# Patient Record
Sex: Male | Born: 1970 | Race: White | Hispanic: No | Marital: Married
Health system: Southern US, Community
[De-identification: ages and names within clinical notes are randomized; demographics above are authoritative.]

## PROBLEM LIST (undated history)

## (undated) HISTORY — PX: WISDOM TOOTH EXTRACTION: SHX21

---

## 2015-09-30 ENCOUNTER — Ambulatory Visit: Payer: BLUE CROSS/BLUE SHIELD | Attending: Otolaryngology

## 2015-09-30 DIAGNOSIS — R490 Dysphonia: Secondary | ICD-10-CM | POA: Insufficient documentation

## 2015-10-05 NOTE — Patient Instructions (Signed)
Practice the abdominal breathing twice a day for at least 15 minutes

## 2015-10-05 NOTE — Therapy (Signed)
[facility] [address] [place], [place], [address] Phone: [phone]   Fax:  [phone]  Speech Language Pathology Evaluation  Patient Details  Name: Chadi Midyette MRN: [id number] Date of Birth: 1970/11/09 Referring Provider: Melida Quitter  Encounter Date: 09/30/2015      End of Session - 10/05/15 0848    Visit Number 1   Number of Visits 9   Date for SLP Re-Evaluation 11/25/15   SLP Start Time 0812   SLP Stop Time  [id number]  pt arrived 10 minutes late   SLP Time Calculation (min) 35 min   Activity Tolerance Patient tolerated treatment well      No past medical history on file.  No past surgical history on file.  There were no vitals filed for this visit.          SLP Evaluation [facility] - 10/05/15 0849    SLP Visit Information   SLP Received On 09/30/15   Referring Provider Melida Quitter   Onset Date 6 months   Medical Diagnosis Vocal nodules   Pain Assessment   Currently in Pain? No/denies   Cognition   Overall Cognitive Status Within Functional Limits for tasks assessed   Auditory Comprehension   Overall Auditory Comprehension Appears within functional limits for tasks assessed   Verbal Expression   Overall Verbal Expression Appears within functional limits for tasks assessed   Oral Motor/Sensory Function   Overall Oral Motor/Sensory Function Appears within functional limits for tasks assessed   Motor Speech   Phonation Impaired   Vocal Abuses Prolonged Vocal Use;Habitual Hyperphonia     Pt's rating of his voice today was 5.5/10 (10=normal voice). S/Z ratio was WNL, at 1.08, while sustained /a/ measured at 17 seconds, also WNL. Pt drinks almost enough water during the day as recommended (approx 50 oz). 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 Pt breaths primarily with his chest and tells  SLP he does not think his voice has any power.                    SLP Education - 10/05/15 0847    Education provided Yes   Education Details abdominal breathing (AB), vocal hygiene/conservation    Person(s) Educated Patient   Methods Explanation;Handout   Comprehension Verbalized understanding          SLP Short Term Goals - 10/05/15 1049    SLP SHORT TERM GOAL #1   Title pt will report following/feeling 2 aspects of vocal hygiene/conservation program between three sessions   Time 4   Period --  sessions   Status New   SLP SHORT TERM GOAL #2   Title pt will rate voice at or > 7.0/10 (10=normal voice) over two consecutive sessions   Time 4   Period --  visits   Status New          SLP Long Term Goals - 10/05/15 1050    SLP LONG TERM GOAL #1   Title pt will report feeling/focusing on 3 aspects of vocal hygiene/conservation program between five total sessions   Time 8   Period --  visits   Status New   SLP LONG TERM GOAL #2   Title pt will report voice quality of 8.0 or above over two sessions   Time 8   Period --  visits   Status New  SLP LONG TERM GOAL #3   Title pt will demo 10 minutes mod complex conversation using abdominal breathing 80% of the time   Time 8   Period --  visits   Status New          Plan - 10/05/15 0849    Clinical Impression Statement pt presents with min-mod hoarseness for >6 months, with vocal nodules ID'd on visual inspection. Pt would benefit from skilled ST addressing vocal hygiene and also correct/proper vocal use.   Speech Therapy Frequency 1x /week   Duration --  8 weeks   Treatment/Interventions SLP instruction and feedback;Patient/family education;Compensatory strategies;Internal/external aids   Potential to Achieve Goals Good   SLP Home Exercise Plan provided today   Consulted and Agree with Plan of Care Patient      Patient will benefit from skilled therapeutic intervention in order to improve the  following deficits and impairments:   Dysphonia    Problem List There are no active problems to display for this patient.   [facility], [place]  10/05/2015, 10:59 AM  [facility] [address] [place], [place], [address] Phone: [phone]   Fax:  [phone]  Name: Ripken Monterrosa MRN: [id number] Date of Birth: 1970/08/23

## 2015-10-20 ENCOUNTER — Ambulatory Visit: Payer: BLUE CROSS/BLUE SHIELD

## 2015-11-04 ENCOUNTER — Ambulatory Visit: Payer: BLUE CROSS/BLUE SHIELD | Attending: Otolaryngology

## 2015-11-04 DIAGNOSIS — R490 Dysphonia: Secondary | ICD-10-CM | POA: Insufficient documentation

## 2015-11-04 NOTE — Therapy (Signed)
[facility] [address] [place], [place], [address] Phone: [phone]   Fax:  [phone]  Speech Language Pathology Treatment  Patient Details  Name: Philip Hall MRN: [id number] Date of Birth: 02/28/1971 Referring Provider: Melida Quitter  Encounter Date: 11/04/2015      End of Session - 11/04/15 1011    Visit Number 2   Number of Visits 9   Date for SLP Re-Evaluation 11/25/15   SLP Start Time 0807  pt 6 minutes late   SLP Stop Time  0847   SLP Time Calculation (min) 40 min   Activity Tolerance Patient tolerated treatment well      No past medical history on file.  No past surgical history on file.  There were no vitals filed for this visit.      Subjective Assessment - 11/04/15 0811    Subjective Pt rates voice 4/10 today. "(I've been a) Bad patient."   Currently in Pain? No/denies               ADULT SLP TREATMENT - 11/04/15 0838      General Information   Behavior/Cognition Alert;Cooperative;Pleasant mood     Treatment Provided   Treatment provided Cognitive-Linquistic     Cognitive-Linquistic Treatment   Treatment focused on Voice   Skilled Treatment Pt has not completed practice with abdominal breathing (AB) as SLP directed, but has made gains with H2O intake and some gains with reducing frequency of use/overuse. SLP reiterated pt must also make strides with reduction of throat clearing as pt cleared throat x5 times in first ten minutes of tx. After that pt cleared throat 5 times (in last 30 minutes).  SLP also reiterated the need to practice AB at least 15 minutes twice a day in order to break habit of clavicular/chest breathing. Upon observation pt noted to perform AB correctly at rest when focusing on this. Pt and SLP came to the conclusion that pt knew what he needed to do, and was completing AB correctly, he just needed to complete what he know he needs to do. Discharge likely next  visit, if another visit occurs. Pt is going to make follow up with Dr. Redmond Baseman and wait until after that follow up to fully d/c from [facility] or not. Pt to contact SLP after [facility] visit.     Assessment / Recommendations / Plan   Plan Other (Comment)  see "Skilled treatment" last sentence     Progression Toward Goals   Progression toward goals Progressing toward goals          SLP Education - 11/04/15 1011    Education provided Yes   Education Details AB, need to practice AB x2/day, vocal hygiene   Methods Explanation   Comprehension Verbalized understanding          SLP Short Term Goals - 11/04/15 1013      SLP SHORT TERM GOAL #1   Title pt will report following/feeling 2 aspects of vocal hygiene/conservation program between three sessions   Baseline one session 11-04-15   Time 4   Period --  sessions   Status On-going     SLP SHORT TERM GOAL #2   Title pt will rate voice at or > 7.0/10 (10=normal voice) over two consecutive sessions   Time 4   Period --  visits   Status On-going          SLP Long Term Goals - 11/04/15 1013      SLP LONG  TERM GOAL #1   Title pt will report feeling/focusing on 3 aspects of vocal hygiene/conservation program between five total sessions   Time 8   Period --  visits   Status On-going     SLP LONG TERM GOAL #2   Title pt will report voice quality of 8.0 or above over two sessions   Time 8   Period --  visits   Status On-going     SLP LONG TERM GOAL #3   Title pt will demo 10 minutes mod complex conversation using abdominal breathing 80% of the time   Time 8   Period --  visits   Status On-going          Plan - 11/04/15 1011    Clinical Impression Statement pt presents with mod hoarseness today, worse since eval. Pt has made some chanages to lifestyle to improve vocal health however has not completed AB practice and in SLP's opinion has not gone to extent necessary to heal his voice. He would benefit from skilled ST addressing  vocal hygiene and also correct/proper vocal use.   Speech Therapy Frequency 1x /week   Duration --  8 weeks   Treatment/Interventions SLP instruction and feedback;Patient/family education;Compensatory strategies;Internal/external aids   Potential to Achieve Goals Fair   Potential Considerations Cooperation/participation level      Patient will benefit from skilled therapeutic intervention in order to improve the following deficits and impairments:   Dysphonia    Problem List There are no active problems to display for this patient.   [facility] ,[place], [place]  11/04/2015, 10:14 AM  [facility] [address] [place], [place], [address] Phone: [phone]   Fax:  [phone]   Name: Philip Hall MRN: [id number] Date of Birth: 07/19/1970

## 2015-11-04 NOTE — Patient Instructions (Signed)
Continue to focus on incr'd water, less talking and less loud talking (no/limited phone conversations in car; talk with bluetooth near mouth if necessary) Practice abdominal breathing 15 minutes twice a day

## 2015-11-17 DIAGNOSIS — M79661 Pain in right lower leg: Secondary | ICD-10-CM | POA: Diagnosis not present

## 2015-11-29 DIAGNOSIS — R490 Dysphonia: Secondary | ICD-10-CM | POA: Diagnosis not present

## 2015-11-29 DIAGNOSIS — J383 Other diseases of vocal cords: Secondary | ICD-10-CM | POA: Diagnosis not present

## 2015-12-12 DIAGNOSIS — J040 Acute laryngitis: Secondary | ICD-10-CM | POA: Diagnosis not present

## 2015-12-12 DIAGNOSIS — J381 Polyp of vocal cord and larynx: Secondary | ICD-10-CM | POA: Diagnosis not present

## 2015-12-12 DIAGNOSIS — R490 Dysphonia: Secondary | ICD-10-CM | POA: Diagnosis not present

## 2015-12-12 DIAGNOSIS — J383 Other diseases of vocal cords: Secondary | ICD-10-CM | POA: Diagnosis not present

## 2016-04-23 DIAGNOSIS — H9122 Sudden idiopathic hearing loss, left ear: Secondary | ICD-10-CM | POA: Diagnosis not present

## 2016-05-18 DIAGNOSIS — H9042 Sensorineural hearing loss, unilateral, left ear, with unrestricted hearing on the contralateral side: Secondary | ICD-10-CM | POA: Diagnosis not present

## 2016-05-18 DIAGNOSIS — H9122 Sudden idiopathic hearing loss, left ear: Secondary | ICD-10-CM | POA: Diagnosis not present

## 2016-08-07 DIAGNOSIS — Z125 Encounter for screening for malignant neoplasm of prostate: Secondary | ICD-10-CM | POA: Diagnosis not present

## 2016-08-07 DIAGNOSIS — Z0000 Encounter for general adult medical examination without abnormal findings: Secondary | ICD-10-CM | POA: Diagnosis not present

## 2016-08-08 DIAGNOSIS — E784 Other hyperlipidemia: Secondary | ICD-10-CM | POA: Diagnosis not present

## 2016-08-08 DIAGNOSIS — Z125 Encounter for screening for malignant neoplasm of prostate: Secondary | ICD-10-CM | POA: Diagnosis not present

## 2016-08-08 DIAGNOSIS — Z0000 Encounter for general adult medical examination without abnormal findings: Secondary | ICD-10-CM | POA: Diagnosis not present

## 2017-01-10 NOTE — Therapy (Signed)
[facility] [address] [place], [place], [address] Phone: [phone]   Fax:  [phone]  Patient Details  Name: Philip Hall MRN: [id number] Date of Birth: May 03, 1970 Referring Provider:  No ref. provider found  Encounter Date: 01/10/2017  SPEECH THERAPY DISCHARGE SUMMARY  Visits from Start of Care: two   Current functional level related to goals / functional outcomes:  Pt's goals on his last scheduled therapy session (11-04-15)  and his last clinical impression statement were as follows:  SLP Short Term Goals - 11/04/15 1013              SLP SHORT TERM GOAL #1    Title pt will report following/feeling 2 aspects of vocal hygiene/conservation program between three sessions    Baseline one session 11-04-15    Time 4    Period --  sessions    Status On-going         SLP SHORT TERM GOAL #2    Title pt will rate voice at or > 7.0/10 (10=normal voice) over two consecutive sessions    Time 4    Period --  visits    Status On-going                       SLP Long Term Goals - 11/04/15 1013              SLP LONG TERM GOAL #1    Title pt will report feeling/focusing on 3 aspects of vocal hygiene/conservation program between five total sessions    Time 8    Period --  visits    Status On-going         SLP LONG TERM GOAL #2    Title pt will report voice quality of 8.0 or above over two sessions    Time 8    Period --  visits    Status On-going         SLP LONG TERM GOAL #3    Title pt will demo 10 minutes mod complex conversation using abdominal breathing 80% of the time    Time 8    Period --  visits    Status On-going                       Plan - 11/04/15 1011     Clinical Impression Statement pt presents with mod hoarseness today, worse since eval. Pt has made some chanages to lifestyle to improve vocal health however has not completed AB practice and in SLP's opinion has not gone to  extent necessary to heal his voice. He would benefit from skilled ST addressing vocal hygiene and also correct/proper vocal use.      Remaining deficits: Assumed deficits remain, as pt did not return to ST following his visit on 11-04-15.   Education / Equipment: Vocal hygiene measures.   Plan: Patient agrees to discharge.  Patient goals were not met. Patient is being discharged due to not returning since the last visit.  ?????       [place] ,MS, CCC-SLP  01/10/2017, 1:55 PM  [facility] [address] [place], [place], [address] Phone: [phone]   Fax:  [phone]

## 2017-03-26 HISTORY — PX: ACOUSTIC NEUROMA RESECTION: SHX5713

## 2017-11-22 DIAGNOSIS — E7849 Other hyperlipidemia: Secondary | ICD-10-CM | POA: Diagnosis not present

## 2017-11-22 DIAGNOSIS — Z125 Encounter for screening for malignant neoplasm of prostate: Secondary | ICD-10-CM | POA: Diagnosis not present

## 2017-11-29 DIAGNOSIS — Z1389 Encounter for screening for other disorder: Secondary | ICD-10-CM | POA: Diagnosis not present

## 2017-11-29 DIAGNOSIS — Z0000 Encounter for general adult medical examination without abnormal findings: Secondary | ICD-10-CM | POA: Diagnosis not present

## 2017-12-31 DIAGNOSIS — H9042 Sensorineural hearing loss, unilateral, left ear, with unrestricted hearing on the contralateral side: Secondary | ICD-10-CM | POA: Diagnosis not present

## 2018-01-03 ENCOUNTER — Other Ambulatory Visit: Payer: Self-pay | Admitting: Otolaryngology

## 2018-01-03 DIAGNOSIS — H9120 Sudden idiopathic hearing loss, unspecified ear: Secondary | ICD-10-CM

## 2018-01-21 ENCOUNTER — Ambulatory Visit
Admission: RE | Admit: 2018-01-21 | Discharge: 2018-01-21 | Disposition: A | Discharge status: Home / Self Care | Payer: BLUE CROSS/BLUE SHIELD | Source: Ambulatory Visit | Attending: Otolaryngology | Admitting: Otolaryngology

## 2018-01-21 DIAGNOSIS — H9120 Sudden idiopathic hearing loss, unspecified ear: Secondary | ICD-10-CM

## 2018-01-21 DIAGNOSIS — D333 Benign neoplasm of cranial nerves: Secondary | ICD-10-CM | POA: Diagnosis not present

## 2018-01-21 MED ORDER — GADOBENATE DIMEGLUMINE 529 MG/ML IV SOLN
16.0000 mL | Freq: Once | INTRAVENOUS | Status: AC | PRN
Start: 2018-01-21 — End: 2018-01-21
  Administered 2018-01-21: 16 mL via INTRAVENOUS

## 2018-01-23 DIAGNOSIS — H918X2 Other specified hearing loss, left ear: Secondary | ICD-10-CM | POA: Diagnosis not present

## 2018-01-23 DIAGNOSIS — D333 Benign neoplasm of cranial nerves: Secondary | ICD-10-CM | POA: Diagnosis not present

## 2018-02-03 DIAGNOSIS — R030 Elevated blood-pressure reading, without diagnosis of hypertension: Secondary | ICD-10-CM | POA: Diagnosis not present

## 2018-02-03 DIAGNOSIS — D333 Benign neoplasm of cranial nerves: Secondary | ICD-10-CM | POA: Diagnosis not present

## 2018-02-05 DIAGNOSIS — D333 Benign neoplasm of cranial nerves: Secondary | ICD-10-CM | POA: Diagnosis not present

## 2018-02-10 DIAGNOSIS — D333 Benign neoplasm of cranial nerves: Secondary | ICD-10-CM | POA: Diagnosis not present

## 2018-03-04 DIAGNOSIS — D331 Benign neoplasm of brain, infratentorial: Secondary | ICD-10-CM | POA: Diagnosis not present

## 2018-03-04 DIAGNOSIS — M8938 Hypertrophy of bone, other site: Secondary | ICD-10-CM | POA: Diagnosis not present

## 2018-03-04 DIAGNOSIS — G9389 Other specified disorders of brain: Secondary | ICD-10-CM | POA: Diagnosis not present

## 2018-03-04 DIAGNOSIS — R110 Nausea: Secondary | ICD-10-CM | POA: Diagnosis not present

## 2018-03-04 DIAGNOSIS — D496 Neoplasm of unspecified behavior of brain: Secondary | ICD-10-CM | POA: Diagnosis not present

## 2018-03-04 DIAGNOSIS — D333 Benign neoplasm of cranial nerves: Secondary | ICD-10-CM | POA: Diagnosis not present

## 2018-03-04 DIAGNOSIS — H918X2 Other specified hearing loss, left ear: Secondary | ICD-10-CM | POA: Diagnosis not present

## 2018-03-05 DIAGNOSIS — G9389 Other specified disorders of brain: Secondary | ICD-10-CM | POA: Diagnosis not present

## 2018-03-06 ENCOUNTER — Other Ambulatory Visit: Payer: Self-pay | Admitting: Neurosurgery

## 2018-03-06 DIAGNOSIS — D333 Benign neoplasm of cranial nerves: Secondary | ICD-10-CM

## 2018-03-24 ENCOUNTER — Other Ambulatory Visit: Payer: Self-pay | Admitting: Neurosurgery

## 2018-03-27 ENCOUNTER — Ambulatory Visit
Admission: RE | Admit: 2018-03-27 | Discharge: 2018-03-27 | Disposition: A | Discharge status: Home / Self Care | Payer: BLUE CROSS/BLUE SHIELD | Source: Ambulatory Visit | Attending: Neurosurgery | Admitting: Neurosurgery

## 2018-03-27 DIAGNOSIS — D333 Benign neoplasm of cranial nerves: Secondary | ICD-10-CM

## 2018-03-27 DIAGNOSIS — Z9889 Other specified postprocedural states: Secondary | ICD-10-CM | POA: Diagnosis not present

## 2018-03-27 MED ORDER — GADOBENATE DIMEGLUMINE 529 MG/ML IV SOLN
16.0000 mL | Freq: Once | INTRAVENOUS | Status: AC | PRN
  Administered 2018-03-27: 16 mL via INTRAVENOUS

## 2018-04-01 ENCOUNTER — Other Ambulatory Visit: Payer: Self-pay | Admitting: Neurosurgery

## 2018-04-01 DIAGNOSIS — D333 Benign neoplasm of cranial nerves: Secondary | ICD-10-CM

## 2018-04-17 ENCOUNTER — Ambulatory Visit: Payer: BLUE CROSS/BLUE SHIELD | Attending: Neurosurgery | Admitting: Rehabilitative and Restorative Service Providers"

## 2018-04-17 ENCOUNTER — Encounter: Payer: Self-pay | Admitting: Rehabilitative and Restorative Service Providers"

## 2018-04-17 DIAGNOSIS — R2681 Unsteadiness on feet: Secondary | ICD-10-CM | POA: Diagnosis present

## 2018-04-17 DIAGNOSIS — R42 Dizziness and giddiness: Secondary | ICD-10-CM | POA: Diagnosis present

## 2018-04-17 DIAGNOSIS — R2689 Other abnormalities of gait and mobility: Secondary | ICD-10-CM | POA: Insufficient documentation

## 2018-04-17 NOTE — Patient Instructions (Signed)
Gaze Stabilization - Tip Card  1.Target must remain in focus, not blurry, and appear stationary while head is in motion. 2.Perform exercises with small head movements (45 to either side of midline). 3.Increase speed of head motion so long as target is in focus. 4.If you wear eyeglasses, be sure you can see target through lens (therapist will give specific instructions for bifocal / progressive lenses). 5.These exercises may provoke dizziness or nausea. Work through these symptoms. If too dizzy, slow head movement slightly. Rest between each exercise. 6.Exercises demand concentration; avoid distractions. 7.For safety, perform standing exercises close to a counter, wall, corner, or next to someone.  Copyright  VHI. All rights reserved.   Gaze Stabilization - Standing Feet Apart   Feet shoulder width apart, keeping eyes on target on wall 3 feet away, tilt head down slightly and move head side to side for 60 seconds. Repeat while moving head up and down for 60 seconds. *Work up to tolerating 60 seconds, as able. Do 3 sessions per day.  Copyright  VHI. All rights reserved.   Feet Together (Compliant Surface) Head Motion - Eyes Open    With eyes open, standing on compliant surface: __pillow___, feet together, move head slowly: up and down x 10 reps, side to side x 10 reps. Do __2__ sessions per day.  Copyright  VHI. All rights reserved.   Feet Together (Compliant Surface) Varied Arm Positions - Eyes Closed    Stand on compliant surface: _pillow___ with feet together and arms out. Close eyes and visualize upright position. Hold_30___ seconds. Repeat __3__ times per session. Do __2__ sessions per day.  Copyright  VHI. All rights reserved.   Single Leg - Eyes Closed    While standing on left leg, close eyes and maintain balance. Hold___10 _ seconds. Repeat __3__ times per session. Do __2__ sessions per day.  Copyright  VHI. All rights reserved.   Turning in Place: Solid  Surface    Standing in place, lead with head and turn slowly making full turns toward left. Repeat _5___ times per session. Do _2___ sessions per day.  Copyright  VHI. All rights reserved.

## 2018-04-18 NOTE — Therapy (Signed)
[facility] [address] [place], [place], [address] Phone: [phone]   Fax:  [phone]  Physical Therapy Evaluation  Patient Details  Name: Philip Hall MRN: [id number] Date of Birth: 1970/10/17 No data recorded  Encounter Date: 04/17/2018  PT End of Session - 04/18/18 1133    Visit Number  1    Number of Visits  6    Date for PT Re-Evaluation  06/17/18    Authorization Type  [employer] 30 VL    PT Start Time  0903    PT Stop Time  0938    PT Time Calculation (min)  35 min    Activity Tolerance  Patient tolerated treatment well    Behavior During Therapy  [facility] for tasks assessed/performed       History reviewed. No pertinent past medical history.  History reviewed. No pertinent surgical history.  There were no vitals filed for this visit.   Subjective Assessment - 04/17/18 0906    Subjective  The patient is s/p L acoustic neuroma resection 7 weeks ago. 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  He notes feeling more "fragile" with movement.  He was told to hold off on exertional exercise for 3 months.  He notes brief sensations of imbalance with quick turns.  He has returned to driving.     Pertinent History  no vision changes, L side hearing absent.     Patient Stated Goals  Full return to prior activity level.      Currently in Pain?  No/denies         [facility] PT Assessment - 04/17/18 0914      Assessment   Medical Diagnosis  L acoustic neuroma resection    Onset Date/Surgical Date  03/04/18    Hand Dominance  Right    Prior Therapy  none      Precautions   Precautions  None      Restrictions   Weight Bearing Restrictions  No      Balance Screen   Has the patient fallen in the past 6 months  No    Has the patient had a decrease in activity level because of a fear of falling?   No    Is the patient reluctant to leave their home because of a fear of falling?   No       Prior Function   Level of Independence  Independent    Vocation  Full time employment    Scientist, water quality, participates in surgery x hours, very physical in nature      Cognition   Overall Cognitive Status  Within Functional Limits for tasks assessed      Sensation   Light Touch  Appears Intact      ROM / Strength   AROM / PROM / Strength  AROM;Strength      AROM   Overall AROM   Within functional limits for tasks performed      Strength   Overall Strength  Within functional limits for tasks performed      Ambulation/Gait   Ambulation/Gait  Yes    Ambulation/Gait Assistance  7: Independent    Ambulation Distance (Feet)  300 Feet    Assistive device  None    Gait Pattern  Within Functional Limits    Ambulation Surface  Level;Indoor    Gait velocity  normal per observation    Gait Comments  Due to late start of eval and need to intiiate HEP, PT did not perform dynamic gait activities.  Plan to further assess FGA.      Balance   Balance Assessed  Yes      Static Standing Balance   Static Standing - Balance Support  No upper extremity supported    Static Standing - Level of Assistance  7: Independent    Static Standing Balance -  Activities   Single Leg Stance - Right Leg;Single Leg Stance - Left Leg;Romberg - Eyes Opened;Romberg - Eyes Closed;Romberg - Eyes Opened, Foam;Romberg - Eyes Closed , Foam    Static Standing - Comment/# of Minutes  Patient able to maintain SLS on R and L LEs x 10 seconds with postural correction at ankles and trunk.  He can perform 30 seconds EO/EC romberg on solid surfaces.  He can perform EO  + foam x 30 econds.  He can perform EC on foam x 30 seconds, however has significant sway noted.       Dynamic Standing Balance   Dynamic Standing - Balance Support  No upper extremity supported    Dynamic Standing - Level of Assistance  6: Modified independent (Device/Increase time)    Dynamic Standing - Balance Activities  Head  nods;Head turns;Compliant surface    Dynamic Standing - Comments  The patient has increased sway with narrowing base of support + head motion.  Also has difficulty on compliant surfaces with eyes closed.  He is able to self recover from postural sway.      High Level Balance   High Level Balance Comments  PT focused on challenging activities to initiate a home program.  *NEED TO FURTHER ASSESS SENSORY ORGANIZATION TESTING AND FGA.                Objective measurements completed on examination: See above findings.      [facility] Adult PT Treatment/Exercise - 04/17/18 0914      Neuro Re-ed    Neuro Re-ed Details   Initiated HEP-- see patient instructions.        Vestibular Treatment/Exercise - 04/17/18 1142      Vestibular Treatment/Exercise   Vestibular Treatment Provided  Gaze    Gaze Exercises  X1 Viewing Horizontal;X1 Viewing Vertical      X1 Viewing Horizontal   Foot Position  standing feet apart    Reps  2    Comments  PT educated on maintaining fixation on gaze and prioritizing this exercise for gaze adpatation.    Patient able to tolerate 60 seconds.       X1 Viewing Vertical   Foot Position  standing feet apart    Comments  Discussed performing for HEP            PT Education - 04/18/18 1132    Education Details  HEP initiated:  gaze x 1 adaptation horiz/vertical, 360 degree turns, single leg stance, foam with eyes closed, foam with eyes open + head motion    Person(s) Educated  Patient    Methods  Explanation;Demonstration;Handout    Comprehension  Verbalized understanding;Returned demonstration       PT Short Term Goals - 04/18/18 1144      PT SHORT TERM GOAL #1   Title  The patient will be indep with HEP for gaze x 1 viewing, high level balance, habituation activities.    Time  4    Period  Weeks    Status  New    Target  Date  05/18/18      PT SHORT TERM GOAL #2   Title  The patient will tolerate gaze x 1 adaptation x 2 minutes nonstop with  symptoms < or equal to 5/10    Time  4    Period  Weeks    Status  New    Target Date  05/18/18      PT SHORT TERM GOAL #3   Title  The patient will have SOT completed for baseline measure of vestibular input use.    Time  4    Period  Weeks    Status  New    Target Date  05/18/18      PT SHORT TERM GOAL #4   Title  The patinet will have FGA completed for baseline measure.    Time  4    Period  Weeks    Status  New    Target Date  05/18/18      PT SHORT TERM GOAL #5   Title  The patient will have SVA versus DVA completed.    Time  4    Period  Weeks    Status  New    Target Date  05/18/18        PT Long Term Goals - 04/18/18 1146      PT LONG TERM GOAL #1   Title  The patient will be indep with HEP progression.    Time  8    Period  Weeks    Status  New    Target Date  06/17/18      PT LONG TERM GOAL #2   Title  SOT, FGA, and SVA/DVA goals to follow once baseline established.    Time  8    Status  New    Target Date  06/17/18      PT LONG TERM GOAL #3   Title  The patient will note return to prior work schedule, tolerating long standing times and physical activity required to perform job duties.    Time  8    Period  Weeks    Status  New    Target Date  06/17/18             Plan - 04/18/18 1306    Clinical Impression Statement  The patient is a 48 year old male presenting to OP physical therapy 7 weeks post L acoustic neuroma resection.  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  Today's evaluation began late due to patient running late, therefore, established HEP to initiate vestibular rehab and plan to further assess FGA, SOT and dynamic visual acuity at next visit.    Clinical Presentation  Stable    Clinical Decision Making  Low    Rehab Potential  Good    PT Frequency  1x / week    PT Duration  6 weeks     PT Treatment/Interventions  ADLs/Self Care Home Management;Therapeutic activities;Therapeutic exercise;Vestibular;Patient/family education;Functional mobility training;Gait training;Neuromuscular re-education;Balance training;Manual techniques    PT Next Visit Plan  BEGIN WITH SOT, FGA, DVA.  Then check HEP, progress gaze x 1 up to 2 minutes and narrow base of support as able.  Add dynamic balance activities on compliant surfaces as able.    Consulted and Agree with Plan of Care  Patient       Patient will benefit  from skilled therapeutic intervention in order to improve the following deficits and impairments:  Abnormal gait, Decreased activity tolerance, Decreased balance, Dizziness  Visit Diagnosis: Other abnormalities of gait and mobility  Unsteadiness on feet  Dizziness and giddiness     Problem List There are no active problems to display for this patient.   [place], [place] 04/18/2018, 1:09 PM  [facility] [address] [place], [place], [address] Phone: [phone]   Fax:  [phone]  Name: Hodari Chuba MRN: [id number] Date of Birth: 11/30/1970

## 2018-05-01 ENCOUNTER — Ambulatory Visit: Payer: BLUE CROSS/BLUE SHIELD | Attending: Neurosurgery | Admitting: Physical Therapy

## 2018-05-01 ENCOUNTER — Encounter: Payer: Self-pay | Admitting: Physical Therapy

## 2018-05-01 DIAGNOSIS — R2681 Unsteadiness on feet: Secondary | ICD-10-CM | POA: Insufficient documentation

## 2018-05-01 DIAGNOSIS — R2689 Other abnormalities of gait and mobility: Secondary | ICD-10-CM | POA: Diagnosis not present

## 2018-05-01 DIAGNOSIS — R42 Dizziness and giddiness: Secondary | ICD-10-CM | POA: Insufficient documentation

## 2018-05-01 NOTE — Therapy (Signed)
[facility] [address] [place], [place], [address] Phone: [phone]   Fax:  [phone]  Physical Therapy Treatment  Patient Details  Name: Philip Hall MRN: [id number] Date of Birth: [birth year] Referring Provider (PT): Recardo Evangelist, MD   Encounter Date: 05/01/2018  PT End of Session - 05/01/18 0813    Visit Number  2    Number of Visits  6    Date for PT Re-Evaluation  06/17/18    Authorization Type  BCBS 30 VL    Authorization - Visit Number  2    Authorization - Number of Visits  30    PT Start Time  [phone]   late arrival   PT Stop Time  0848    PT Time Calculation (min)  36 min    Activity Tolerance  Patient tolerated treatment well    Behavior During Therapy  [facility] for tasks assessed/performed       History reviewed. No pertinent past medical history.  History reviewed. No pertinent surgical history.  There were no vitals filed for this visit.  Subjective Assessment - 05/01/18 1327    Subjective  Reports he is going to do his first surgery later today. Asking appropriate questions re: progression and benefit of rehab (vs what is spontaneous recovery).     Pertinent History  no vision changes, L side hearing absent.     Patient Stated Goals  Full return to prior activity level.      Currently in Pain?  No/denies             Vestibular Assessment - 05/01/18 0001      Visual Acuity   Static  10    Dynamic  4      Tandem walking x 10 steps with very minor imbalance (WNL); did not do FGA          Vestibular Treatment/Exercise - 05/01/18 0001      Vestibular Treatment/Exercise   Vestibular Treatment Provided  Gaze    Gaze Exercises  X1 Viewing Horizontal;X1 Viewing Vertical      X1 Viewing Horizontal   Foot Position  feet apart and then together     Time  --   feet apart x 60 seconds; ft together 30 sec   Reps  3    Comments  educated on degree of head turn, speed of movement  (goal 2/second); need to push until moderately dizzy/offbalance (he had been stopping when he begins to feel bad)      X1 Viewing Vertical   Foot Position  standing feet apart; standing feet together     Time  --   45 sec no effects   Reps  2    Comments  educated on degree of head turn, speed of movement (goal 2/second); need to push until moderately dizzy/offbalance (he had been stopping when he begins to feel bad)         Balance Exercises - 05/01/18 1336      Balance Exercises: Standing   Tandem Stance  Eyes closed;10 secs    SLS  Eyes closed   5 sec   Gait with Head Turns  Forward;Retro;4 reps   and each diagonal; negligable effect up to rt; ++up to left   Tandem Gait  Forward;Upper extremity support   EC   Retro Gait  Head turns   EO and EC with imbalance and off path with head turns       PT Education -  05/01/18 1341    Education Details  how to progress VOR exercise as he improves; rationale for these ex's (esp as pt wants to get back to skiing (water and snow) and surfing; added to HEP; ways to incorporate ex's into his work; consideration for placement of fluor screen during surgery (gets more dizzy with left head turns); how to ease back into aerobic exercise (60-70%of max HR and how to calculate) stationary cycle vs elliptical vs walking on treadmill vs jogging on treadmill and finally jogging over ground; recommend wait 3 mos for weight lifting and concern with increasing intracranial pressure    Person(s) Educated  Patient    Methods  Explanation;Demonstration;Tactile cues;Verbal cues;Handout    Comprehension  Verbalized understanding;Returned demonstration;Verbal cues required;Tactile cues required;Need further instruction       PT Short Term Goals - 04/18/18 1144      PT SHORT TERM GOAL #1   Title  The patient will be indep with HEP for gaze x 1 viewing, high level balance, habituation activities.    Time  4    Period  Weeks    Status  New    Target Date   05/18/18      PT SHORT TERM GOAL #2   Title  The patient will tolerate gaze x 1 adaptation x 2 minutes nonstop with symptoms < or equal to 5/10    Time  4    Period  Weeks    Status  New    Target Date  05/18/18      PT SHORT TERM GOAL #3   Title  The patient will have SOT completed for baseline measure of vestibular input use.    Time  4    Period  Weeks    Status  New    Target Date  05/18/18      PT SHORT TERM GOAL #4   Title  The patinet will have FGA completed for baseline measure.    Time  4    Period  Weeks    Status  New    Target Date  05/18/18      PT SHORT TERM GOAL #5   Title  The patient will have SVA versus DVA completed.    Time  4    Period  Weeks    Status  New    Target Date  05/18/18        PT Long Term Goals - 04/18/18 1146      PT LONG TERM GOAL #1   Title  The patient will be indep with HEP progression.    Time  8    Period  Weeks    Status  New    Target Date  06/17/18      PT LONG TERM GOAL #2   Title  SOT, FGA, and SVA/DVA goals to follow once baseline established.    Time  8    Status  New    Target Date  06/17/18      PT LONG TERM GOAL #3   Title  The patient will note return to prior work schedule, tolerating long standing times and physical activity required to perform job duties.    Time  8    Period  Weeks    Status  New    Target Date  06/17/18            Plan - 05/01/18 1340    Clinical Impression Statement  Patient's HEP updated and corrected his technique  for VORx1. Patient continues to have imbalance and dizziness with higher level skills that involve eyes open and head turns and/or narrow BOS, as well as activities with eyes closed. (Patient admits surprised at the difficulty he had with today's activities). Did not complete SOT due to late arrival, however 6 line difference in static vs dynamic acuity shows continued significant deficit. Educated on potential use of Balancemaster for eval and treatment. Patient can  continue to benefit from OPPT.     Rehab Potential  Good    PT Frequency  1x / week    PT Duration  6 weeks    PT Treatment/Interventions  ADLs/Self Care Home Management;Therapeutic activities;Therapeutic exercise;Vestibular;Patient/family education;Functional mobility training;Gait training;Neuromuscular re-education;Balance training;Manual techniques    PT Next Visit Plan  ?SOT vs use balance master for training??  check HEP, how has he progressed gaze x 1?  Add dynamic balance activities on compliant surfaces as able.    PT Home Exercise Plan  [email or web address]    Consulted and Agree with Plan of Care  Patient       Patient will benefit from skilled therapeutic intervention in order to improve the following deficits and impairments:  Abnormal gait, Decreased activity tolerance, Decreased balance, Dizziness  Visit Diagnosis: Other abnormalities of gait and mobility  Unsteadiness on feet  Dizziness and giddiness     Problem List There are no active problems to display for this patient.   [employer] PT 05/01/2018, 1:53 PM  [facility] [address] [place], [place], [address] Phone: [phone]   Fax:  [phone]  Name: Philip Hall MRN: [id number] Date of Birth: [birth year]

## 2018-05-01 NOTE — Patient Instructions (Addendum)
Access Code: [email or web address]  URL: [email or web address]  Date: 05/01/2018  Prepared by: Barry Brunner   Exercises  Tandem Stance with Eyes Closed - 10 reps - 1 sets - 1-2x daily - 7x weekly  Backwards Walking - 5 reps - 1 sets - - hold - 1x daily - 7x weekly   Gaze Stabilization - Standing Feet Apart   Feet shoulder width apart, keeping eyes on target on wall 3 feet away, tilt head down slightly and move head side to side for 60 seconds. Repeat while moving head up and down for 60 seconds. *Work up to tolerating 60 seconds, as able. Do 3 sessions per day.    Progression: keep feet apart and increase head speed  Then bring feet closer together Then move towards tandem

## 2018-05-15 ENCOUNTER — Ambulatory Visit: Payer: BLUE CROSS/BLUE SHIELD | Admitting: Rehabilitative and Restorative Service Providers"

## 2018-05-28 ENCOUNTER — Other Ambulatory Visit: Payer: Self-pay | Admitting: Specialist

## 2018-05-28 DIAGNOSIS — M25561 Pain in right knee: Secondary | ICD-10-CM

## 2018-05-29 ENCOUNTER — Ambulatory Visit: Payer: BLUE CROSS/BLUE SHIELD | Admitting: Rehabilitative and Restorative Service Providers"

## 2018-05-30 ENCOUNTER — Ambulatory Visit
Admission: RE | Admit: 2018-05-30 | Discharge: 2018-05-30 | Disposition: A | Discharge status: Home / Self Care | Payer: BLUE CROSS/BLUE SHIELD | Source: Ambulatory Visit | Attending: Specialist | Admitting: Specialist

## 2018-05-30 DIAGNOSIS — M25561 Pain in right knee: Secondary | ICD-10-CM

## 2018-05-30 DIAGNOSIS — M23321 Other meniscus derangements, posterior horn of medial meniscus, right knee: Secondary | ICD-10-CM | POA: Diagnosis not present

## 2018-06-02 DIAGNOSIS — M2241 Chondromalacia patellae, right knee: Secondary | ICD-10-CM | POA: Diagnosis not present

## 2018-06-02 DIAGNOSIS — S83231A Complex tear of medial meniscus, current injury, right knee, initial encounter: Secondary | ICD-10-CM | POA: Diagnosis not present

## 2018-06-02 DIAGNOSIS — M25561 Pain in right knee: Secondary | ICD-10-CM | POA: Diagnosis not present

## 2018-06-06 DIAGNOSIS — M23221 Derangement of posterior horn of medial meniscus due to old tear or injury, right knee: Secondary | ICD-10-CM | POA: Diagnosis not present

## 2018-06-06 DIAGNOSIS — M94261 Chondromalacia, right knee: Secondary | ICD-10-CM | POA: Diagnosis not present

## 2018-06-06 DIAGNOSIS — G8918 Other acute postprocedural pain: Secondary | ICD-10-CM | POA: Diagnosis not present

## 2018-09-01 ENCOUNTER — Other Ambulatory Visit (HOSPITAL_COMMUNITY)
Admission: RE | Admit: 2018-09-01 | Discharge: 2018-09-01 | Disposition: A | Discharge status: Home / Self Care | Payer: BC Managed Care – PPO | Source: Ambulatory Visit | Attending: General Surgery | Admitting: General Surgery

## 2018-09-01 DIAGNOSIS — Z1159 Encounter for screening for other viral diseases: Secondary | ICD-10-CM | POA: Diagnosis not present

## 2018-09-01 LAB — SARS CORONAVIRUS 2 BY RT PCR (HOSPITAL ORDER, PERFORMED IN ~~LOC~~ HOSPITAL LAB): SARS Coronavirus 2: NEGATIVE

## 2018-10-03 ENCOUNTER — Ambulatory Visit
Admission: RE | Admit: 2018-10-03 | Discharge: 2018-10-03 | Disposition: A | Discharge status: Home / Self Care | Payer: BLUE CROSS/BLUE SHIELD | Source: Ambulatory Visit | Attending: Neurosurgery | Admitting: Neurosurgery

## 2018-10-03 ENCOUNTER — Other Ambulatory Visit: Payer: Self-pay

## 2018-10-03 DIAGNOSIS — D333 Benign neoplasm of cranial nerves: Secondary | ICD-10-CM

## 2018-10-03 MED ORDER — GADOBENATE DIMEGLUMINE 529 MG/ML IV SOLN
15.0000 mL | Freq: Once | INTRAVENOUS | Status: AC | PRN
  Administered 2018-10-03: 15 mL via INTRAVENOUS

## 2018-12-05 DIAGNOSIS — E7849 Other hyperlipidemia: Secondary | ICD-10-CM | POA: Diagnosis not present

## 2018-12-05 DIAGNOSIS — Z0000 Encounter for general adult medical examination without abnormal findings: Secondary | ICD-10-CM | POA: Diagnosis not present

## 2018-12-05 DIAGNOSIS — Z125 Encounter for screening for malignant neoplasm of prostate: Secondary | ICD-10-CM | POA: Diagnosis not present

## 2018-12-12 DIAGNOSIS — E785 Hyperlipidemia, unspecified: Secondary | ICD-10-CM | POA: Diagnosis not present

## 2018-12-12 DIAGNOSIS — Z86018 Personal history of other benign neoplasm: Secondary | ICD-10-CM | POA: Diagnosis not present

## 2018-12-12 DIAGNOSIS — D696 Thrombocytopenia, unspecified: Secondary | ICD-10-CM | POA: Diagnosis not present

## 2018-12-12 DIAGNOSIS — H9192 Unspecified hearing loss, left ear: Secondary | ICD-10-CM | POA: Diagnosis not present

## 2018-12-12 DIAGNOSIS — Z0000 Encounter for general adult medical examination without abnormal findings: Secondary | ICD-10-CM | POA: Diagnosis not present

## 2018-12-12 DIAGNOSIS — Z1331 Encounter for screening for depression: Secondary | ICD-10-CM | POA: Diagnosis not present

## 2019-01-05 ENCOUNTER — Encounter: Payer: Self-pay | Admitting: Rehabilitative and Restorative Service Providers"

## 2019-01-05 NOTE — Therapy (Signed)
[facility] [address] [place], [place], [address] Phone: [phone]   Fax:  [phone]  Patient Details  Name: Philip Hall MRN: [id number] Date of Birth: 07/15/1970 Referring Provider:  No ref. provider found  Encounter Date: last encounter 05/01/18  PHYSICAL THERAPY DISCHARGE SUMMARY  Visits from Start of Care: 2  Current functional level related to goals / functional outcomes: Patient called to cancel remaining visits- progressing well.  See eval and tx note for most recent status.   Remaining deficits: See tx note 2/6   Education / Equipment: Home program.  Plan: Patient agrees to discharge.  Patient goals were not met. Patient is being discharged due to not returning since the last visit.  ?????     Thank you for the referral of this patient. Christina Weaver, MPT   WEAVER,CHRISTINA 01/05/2019, 11:27 AM  [facility] [address] [place], [place], [address] Phone: [phone]   Fax:  [phone] 

## 2019-01-21 ENCOUNTER — Other Ambulatory Visit: Payer: Self-pay

## 2019-01-21 ENCOUNTER — Other Ambulatory Visit (HOSPITAL_COMMUNITY)
Admission: RE | Admit: 2019-01-21 | Discharge: 2019-01-21 | Disposition: A | Discharge status: Home / Self Care | Payer: BC Managed Care – PPO | Source: Ambulatory Visit | Attending: General Surgery | Admitting: General Surgery

## 2019-01-21 DIAGNOSIS — Z01812 Encounter for preprocedural laboratory examination: Secondary | ICD-10-CM | POA: Insufficient documentation

## 2019-01-21 DIAGNOSIS — Z20828 Contact with and (suspected) exposure to other viral communicable diseases: Secondary | ICD-10-CM | POA: Insufficient documentation

## 2019-01-21 LAB — SARS CORONAVIRUS 2 (TAT 6-24 HRS): SARS Coronavirus 2: NEGATIVE

## 2019-02-02 ENCOUNTER — Other Ambulatory Visit: Payer: Self-pay

## 2019-02-02 ENCOUNTER — Other Ambulatory Visit (HOSPITAL_COMMUNITY)
Admission: RE | Admit: 2019-02-02 | Discharge: 2019-02-02 | Disposition: A | Discharge status: Home / Self Care | Payer: BC Managed Care – PPO | Source: Ambulatory Visit | Attending: General Surgery | Admitting: General Surgery

## 2019-02-02 DIAGNOSIS — Z01812 Encounter for preprocedural laboratory examination: Secondary | ICD-10-CM | POA: Diagnosis not present

## 2019-02-02 DIAGNOSIS — Z20828 Contact with and (suspected) exposure to other viral communicable diseases: Secondary | ICD-10-CM | POA: Diagnosis not present

## 2019-02-02 LAB — SARS CORONAVIRUS 2 (TAT 6-24 HRS): SARS Coronavirus 2: NEGATIVE

## 2019-11-16 DIAGNOSIS — Z20828 Contact with and (suspected) exposure to other viral communicable diseases: Secondary | ICD-10-CM | POA: Diagnosis not present

## 2019-12-21 DIAGNOSIS — Z0000 Encounter for general adult medical examination without abnormal findings: Secondary | ICD-10-CM | POA: Diagnosis not present

## 2019-12-21 DIAGNOSIS — E785 Hyperlipidemia, unspecified: Secondary | ICD-10-CM | POA: Diagnosis not present

## 2019-12-21 DIAGNOSIS — Z125 Encounter for screening for malignant neoplasm of prostate: Secondary | ICD-10-CM | POA: Diagnosis not present

## 2019-12-25 DIAGNOSIS — Z0000 Encounter for general adult medical examination without abnormal findings: Secondary | ICD-10-CM | POA: Diagnosis not present

## 2019-12-25 DIAGNOSIS — E785 Hyperlipidemia, unspecified: Secondary | ICD-10-CM | POA: Diagnosis not present

## 2019-12-25 DIAGNOSIS — Z1331 Encounter for screening for depression: Secondary | ICD-10-CM | POA: Diagnosis not present

## 2020-04-07 ENCOUNTER — Other Ambulatory Visit (HOSPITAL_COMMUNITY)
Admission: RE | Admit: 2020-04-07 | Discharge: 2020-04-07 | Disposition: A | Discharge status: Home / Self Care | Payer: Self-pay | Source: Ambulatory Visit | Attending: General Surgery | Admitting: General Surgery

## 2020-04-07 DIAGNOSIS — U071 COVID-19: Secondary | ICD-10-CM | POA: Insufficient documentation

## 2020-04-07 LAB — SARS CORONAVIRUS 2 BY RT PCR (HOSPITAL ORDER, PERFORMED IN ~~LOC~~ HOSPITAL LAB): SARS Coronavirus 2: POSITIVE — AB

## 2020-04-18 ENCOUNTER — Other Ambulatory Visit: Payer: Self-pay | Admitting: Neurosurgery

## 2020-04-18 DIAGNOSIS — H933X2 Disorders of left acoustic nerve: Secondary | ICD-10-CM

## 2020-05-06 ENCOUNTER — Other Ambulatory Visit: Payer: Self-pay

## 2020-05-06 ENCOUNTER — Ambulatory Visit
Admission: RE | Admit: 2020-05-06 | Discharge: 2020-05-06 | Disposition: A | Discharge status: Home / Self Care | Payer: BC Managed Care – PPO | Source: Ambulatory Visit | Attending: Neurosurgery | Admitting: Neurosurgery

## 2020-05-06 DIAGNOSIS — H933X2 Disorders of left acoustic nerve: Secondary | ICD-10-CM

## 2020-05-06 DIAGNOSIS — H748X2 Other specified disorders of left middle ear and mastoid: Secondary | ICD-10-CM | POA: Diagnosis not present

## 2020-05-06 DIAGNOSIS — G9389 Other specified disorders of brain: Secondary | ICD-10-CM | POA: Diagnosis not present

## 2020-05-06 DIAGNOSIS — G529 Cranial nerve disorder, unspecified: Secondary | ICD-10-CM | POA: Diagnosis not present

## 2020-05-06 MED ORDER — GADOBENATE DIMEGLUMINE 529 MG/ML IV SOLN
15.0000 mL | Freq: Once | INTRAVENOUS | Status: AC | PRN
  Administered 2020-05-06: 15 mL via INTRAVENOUS

## 2021-07-10 DIAGNOSIS — L219 Seborrheic dermatitis, unspecified: Secondary | ICD-10-CM | POA: Diagnosis not present

## 2021-12-27 IMAGING — MR MR HEAD WO/W CM
11 of 12 series · 38 of 48 positions shown · IV contrast (15ml Multihance)
Comparison: MRI of the brain October 03, 2018.

CLINICAL DATA: Disorder of left acoustic nerve.

EXAM:
MRI HEAD WITHOUT AND WITH CONTRAST
TECHNIQUE: Multiplanar, multiecho pulse sequences of the brain and surrounding
structures were obtained without and with intravenous contrast.
CONTRAST:  15mL MULTIHANCE GADOBENATE DIMEGLUMINE 529 MG/ML IV SOLN

[Series 2: T1 · sagittal · 5.0mm · 0.45mm/px · 4 of 25 slices shown (1 of 3)]
[im 1/25]
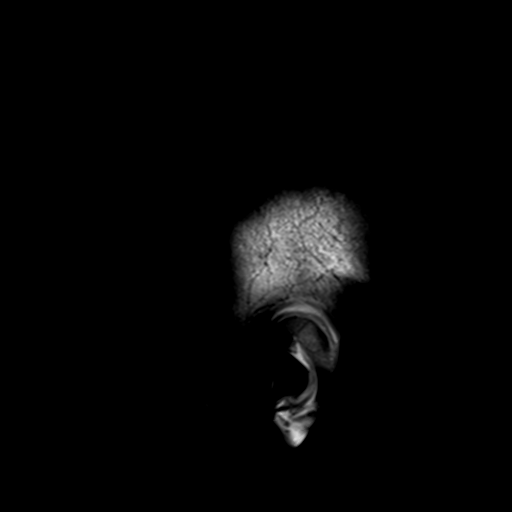
[im 9/25]
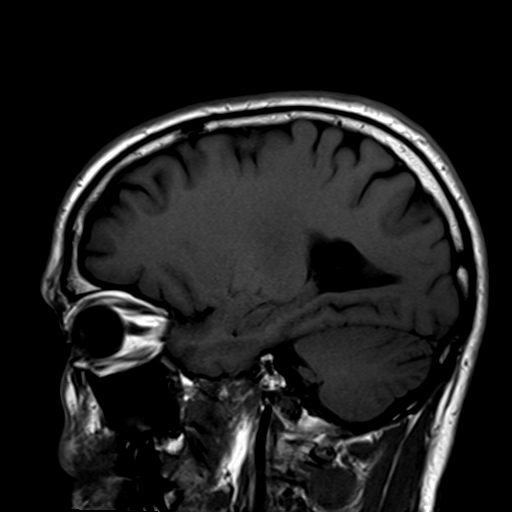
[im 17/25]
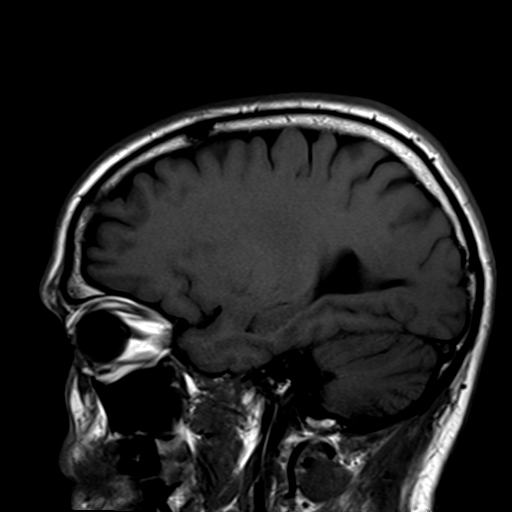
[im 25/25]
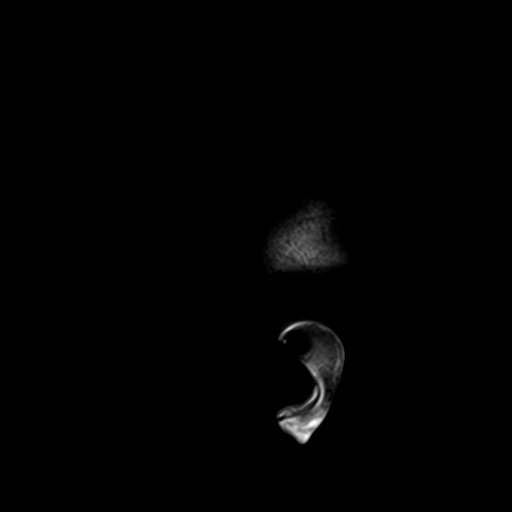

[Series 3: DWI · axial · 3.0mm · 1.80mm/px · z∈[-68,+88]mm · 11 of 106 slices shown (1 of 2)]
[im 1/106]
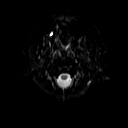
[im 11/106]
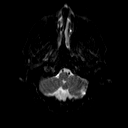
[im 22/106]
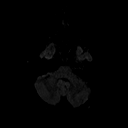
[im 32/106]
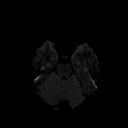
[im 43/106]
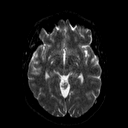
[im 53/106]
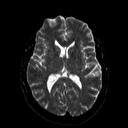
[im 64/106]
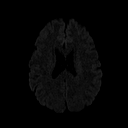
[im 74/106]
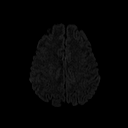
[im 85/106]
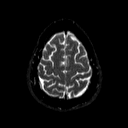
[im 95/106]
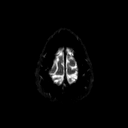
[im 106/106]
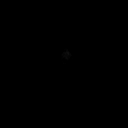

[Series 4: DWI · axial · 3.0mm · 1.80mm/px · z∈[-68,+88]mm · 5 of 51 slices shown (2 of 2)]
[im 1/51]
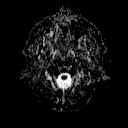
[im 13/51]
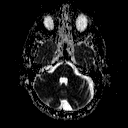
[im 26/51]
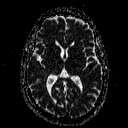
[im 38/51]
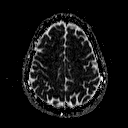
[im 51/51]
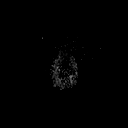

[Series 5: T2 · axial · 5.0mm · 0.45mm/px · z∈[-72,+91]mm · 3 of 26 slices shown]
[im 1/26]
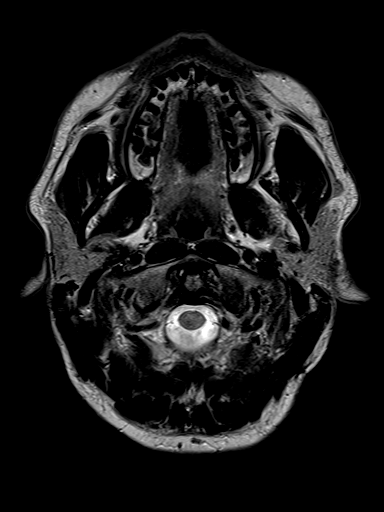
[im 13/26]
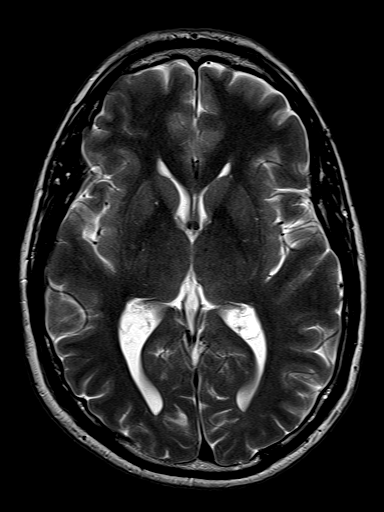
[im 26/26]
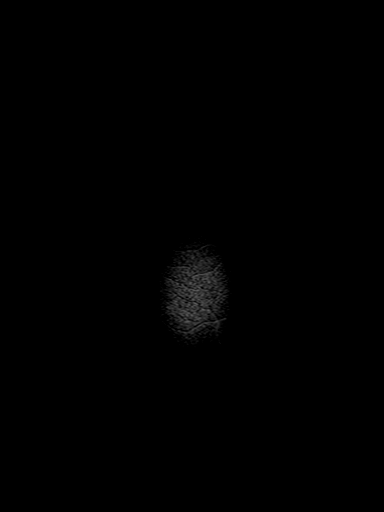

[Series 6: FLAIR · axial · 3.0mm · 0.45mm/px · z∈[-76,+91]mm · 3 of 29 slices shown]
[im 1/29]
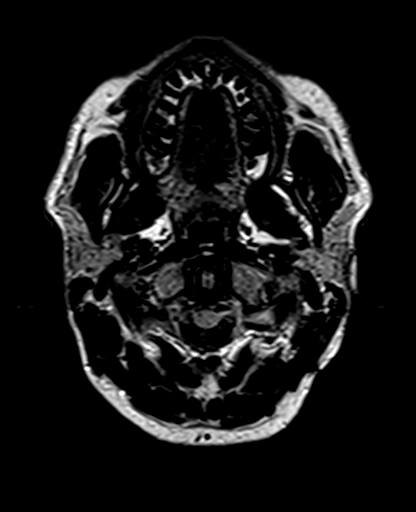
[im 15/29]
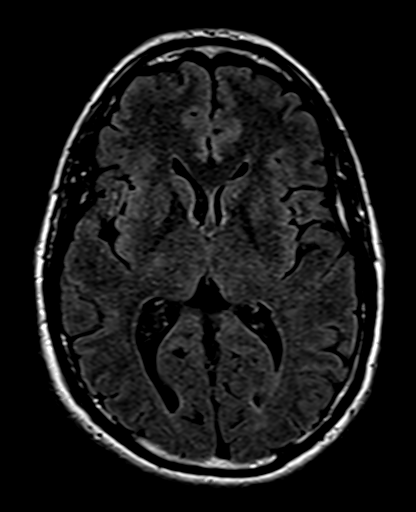
[im 29/29]
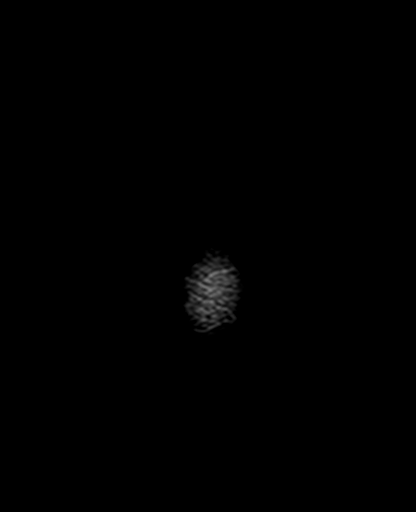

[Series 8: swi_images · axial · 3.0mm · 0.90mm/px · z∈[-67,+86]mm · 5 of 52 slices shown]
[im 1/52]
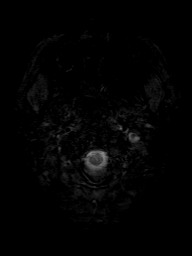
[im 13/52]
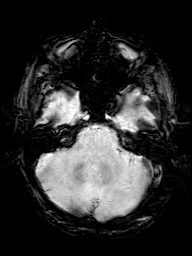
[im 26/52]
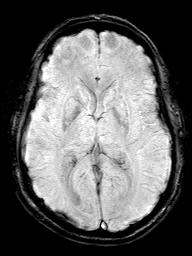
[im 39/52]
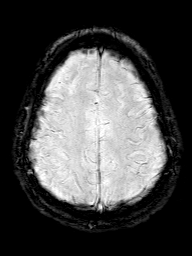
[im 52/52]
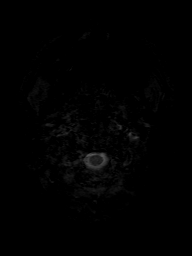

[Series 9: T1 · coronal · 3.0mm · 0.35mm/px · 1 of 11 slices shown (2 of 3)]
[im 1/11]
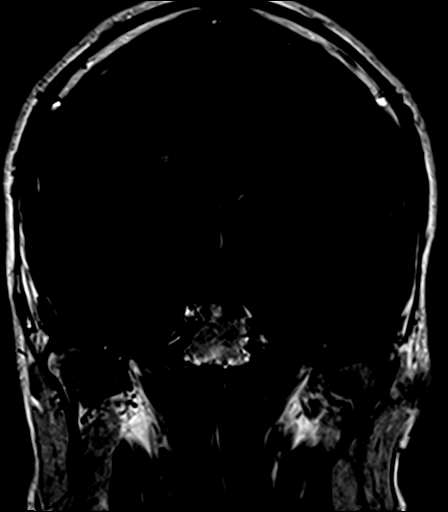

[Series 10: T1 · axial · 3.0mm · 0.35mm/px · 1 of 11 slices shown (3 of 3)]
[im 1/11]
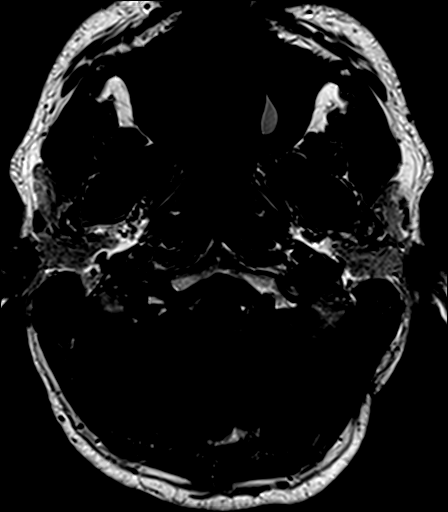

[Series 11: bSSFP · axial · 1.0mm · 0.28mm/px · z∈[-58,-35]mm · 3 of 36 slices shown]
[im 1/36]
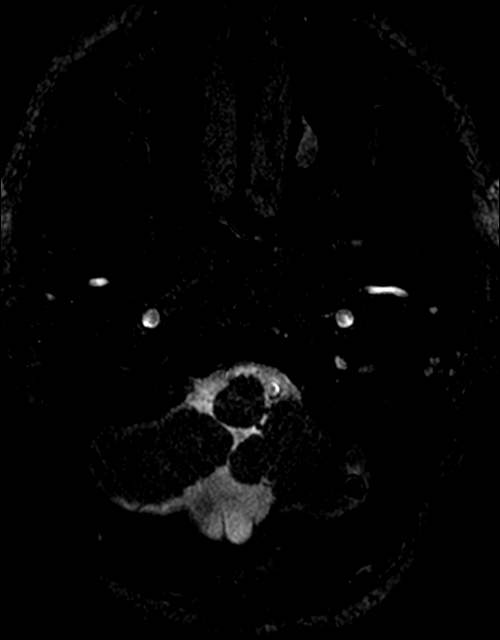
[im 12/36]
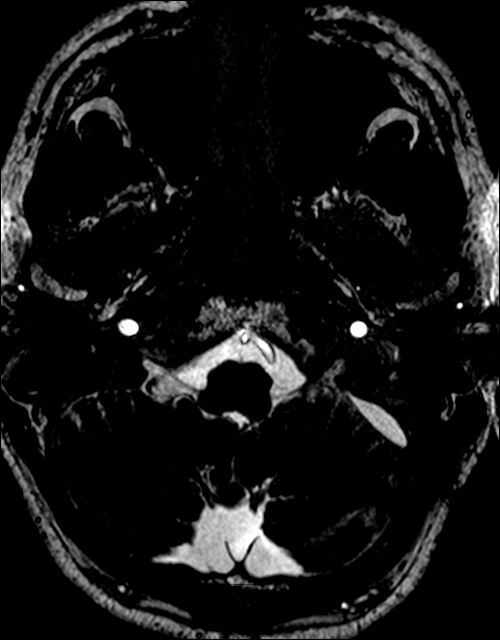
[im 24/36]
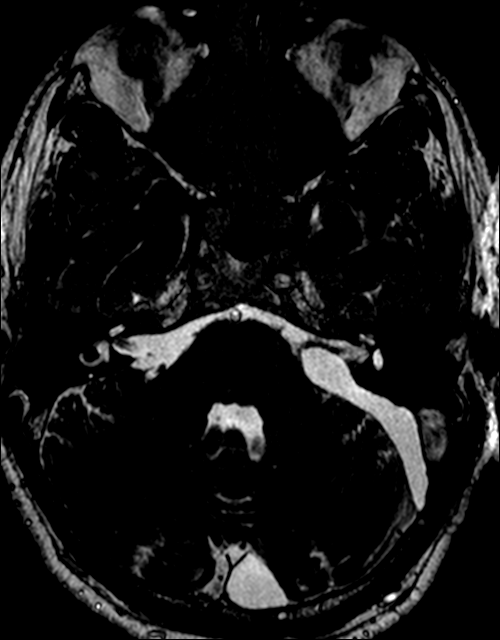

[Series 12: T1 post-contrast · coronal · 3.0mm · 0.35mm/px · 1 of 11 slices shown (1 of 2)]
[im 1/11]
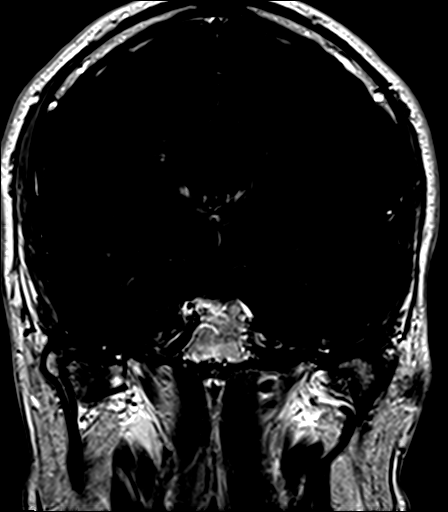

[Series 13: T1 post-contrast · axial · 3.0mm · 0.35mm/px · 1 of 11 slices shown (2 of 2)]
[im 1/11]
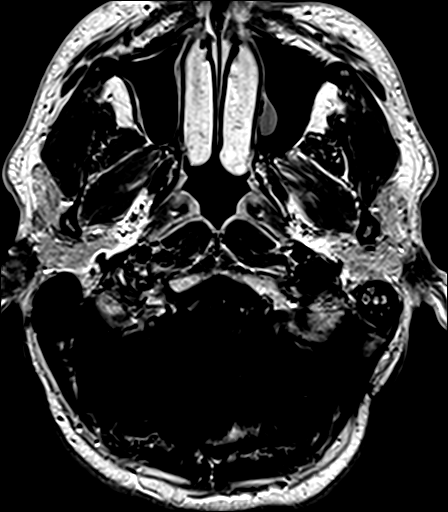

[38 of 48 positions shown; findings below may reference images not displayed]

FINDINGS: Brain: Postsurgical changes from left retrosigmoid craniotomy for
resection of left vestibular schwannoma with persistent left CSF
fluid collection in the left lateral aspect of the posterior fossa
measuring up to 7 mm (8 mm on prior). At the anterior margin of this
fluid collection, a thick linear contrast enhancement is seen
arching towards and extending into the left internal auditory canal.
This appear somewhat thinner when compared to prior MRI measuring
just under 2 mm in thickness compared to 2.4 mm on prior.

No acute infarction, hemorrhage or hydrocephalus.

Vascular: Normal flow voids.

Skull and upper cervical spine: Normal marrow signal.

Sinuses/Orbits: Mucosal thickening throughout the paranasal sinuses.
The orbits are maintained.

Other: Small left mastoid effusion.
IMPRESSION: 1. Further interval decrease in thickness of the curvilinear
cisternal and intracanalicular contrast enhancement which may
represent posttreatment changes and/or residual tumor.
2. Persistent left sided posterior fossa fluid collection which is
mildly decreased in thickness, measuring up to 7 mm in the current
study.

## 2022-04-11 ENCOUNTER — Other Ambulatory Visit: Payer: Self-pay | Admitting: Neurosurgery

## 2022-04-11 DIAGNOSIS — D333 Benign neoplasm of cranial nerves: Secondary | ICD-10-CM

## 2022-05-21 ENCOUNTER — Ambulatory Visit
Admission: RE | Admit: 2022-05-21 | Discharge: 2022-05-21 | Disposition: A | Discharge status: Home / Self Care | Payer: BC Managed Care – PPO | Source: Ambulatory Visit | Attending: Neurosurgery | Admitting: Neurosurgery

## 2022-05-21 DIAGNOSIS — D333 Benign neoplasm of cranial nerves: Secondary | ICD-10-CM

## 2022-05-21 MED ORDER — GADOPICLENOL 0.5 MMOL/ML IV SOLN
8.0000 mL | Freq: Once | INTRAVENOUS | Status: AC | PRN
  Administered 2022-05-21: 8 mL via INTRAVENOUS

## 2023-03-11 ENCOUNTER — Ambulatory Visit: Payer: BC Managed Care – PPO

## 2023-03-11 VITALS — Ht 70.0 in | Wt 179.0 lb

## 2023-03-11 DIAGNOSIS — Z1211 Encounter for screening for malignant neoplasm of colon: Secondary | ICD-10-CM

## 2023-03-11 MED ORDER — NA SULFATE-K SULFATE-MG SULF 17.5-3.13-1.6 GM/177ML PO SOLN: 1.0000 | Freq: Once | ORAL | 0 refills | Status: AC

## 2023-03-11 NOTE — Progress Notes (Signed)
Pre visit completed via phone call; Patient verified name, DOB, and address; No egg or soy allergy known to patient;  No issues known to pt with past sedation with any surgeries or procedures; Patient denies ever being told they had issues or difficulty with intubation;  No FH of Malignant Hyperthermia; Pt is not on diet pills; Pt is not on home 02;  Pt is not on blood thinners;  Pt denies issues with constipation;  No A fib or A flutter; Have any cardiac testing pending--NO Insurance verified during PV appt--- BCBS Pt can ambulate without assistance;  Pt denies use of chewing tobacco; Discussed diabetic/weight loss medication holds; Discussed NSAID holds; Checked BMI to be less than 50; Pt instructed to use Single[employer]com or GoodRx for a price reduction on prep;  Patient's chart reviewed by Cathlyn Parsons CNRA prior to previsit and patient appropriate for the [facility];  Pre visit completed and red dot placed by patient's name on their procedure day (on provider's schedule);   Instructions sent to MyChart per patient request, as well as printed and mailed to the patient;

## 2023-04-02 ENCOUNTER — Telehealth: Payer: Self-pay | Admitting: Gastroenterology

## 2023-04-02 NOTE — Telephone Encounter (Signed)
 PT is calling to have Suprep sent into [facility] on [address]. Please advise.

## 2023-04-02 NOTE — Telephone Encounter (Signed)
 Called and spoke with pharmacist at [facility] also informed that RX would be ready for pick up from pharmacy later today;  Patient verbalized understanding of information/instructions;

## 2023-04-03 ENCOUNTER — Encounter: Payer: Self-pay | Admitting: Gastroenterology

## 2023-04-08 ENCOUNTER — Encounter: Payer: Self-pay | Admitting: Gastroenterology

## 2023-04-08 ENCOUNTER — Ambulatory Visit (AMBULATORY_SURGERY_CENTER): Payer: BC Managed Care – PPO | Admitting: Gastroenterology

## 2023-04-08 VITALS — BP 113/66 | HR 52 | Temp 98.2°F | Resp 14 | Ht 70.0 in | Wt 179.0 lb

## 2023-04-08 DIAGNOSIS — Z800 Family history of malignant neoplasm of digestive organs: Secondary | ICD-10-CM | POA: Diagnosis not present

## 2023-04-08 DIAGNOSIS — K640 First degree hemorrhoids: Secondary | ICD-10-CM | POA: Diagnosis not present

## 2023-04-08 DIAGNOSIS — Z83719 Family history of colon polyps, unspecified: Secondary | ICD-10-CM

## 2023-04-08 DIAGNOSIS — Z1211 Encounter for screening for malignant neoplasm of colon: Secondary | ICD-10-CM

## 2023-04-08 MED ORDER — SODIUM CHLORIDE 0.9 % IV SOLN: 500.0000 mL | Freq: Once | INTRAVENOUS | Status: AC

## 2023-04-08 NOTE — Progress Notes (Signed)
 To pacu, VSS. Report to [email or web address]

## 2023-04-08 NOTE — Op Note (Signed)
 [facility] Patient Name: Philip Hall Procedure Date: 04/08/2023 9:05 AM MRN: [id number] Endoscopist: Sandor Flatter , MD, [id number] Age: 53 Referring MD:  Date of Birth: Jul 26, 1970 Gender: Male Account #: [id number] Procedure:                Colonoscopy Indications:              Screening for colorectal malignant neoplasm, This                            is the patient's first colonoscopy Medicines:                Monitored Anesthesia Care Procedure:                Pre-Anesthesia Assessment:                           - Prior to the procedure, a History and Physical                            was performed, and patient medications and                            allergies were reviewed. The patient's tolerance of                            previous anesthesia was also reviewed. The risks                            and benefits of the procedure and the sedation                            options and risks were discussed with the patient.                            All questions were answered, and informed consent                            was obtained. Prior Anticoagulants: The patient has                            taken no anticoagulant or antiplatelet agents. ASA                            Grade Assessment: I - A normal, healthy patient.                            After reviewing the risks and benefits, the patient                            was deemed in satisfactory condition to undergo the                            procedure.  After obtaining informed consent, the colonoscope                            was passed under direct vision. Throughout the                            procedure, the patient's blood pressure, pulse, and                            oxygen saturations were monitored continuously. The                            CF [id number] #[id number] was introduced through the anus                            and advanced to the the cecum,  identified by                            appendiceal orifice and ileocecal valve. The                            colonoscopy was performed without difficulty. The                            patient tolerated the procedure well. The quality                            of the bowel preparation was good. The ileocecal                            valve, appendiceal orifice, and rectum were                            photographed. Scope In: 9:11:39 AM Scope Out: 9:29:29 AM Scope Withdrawal Time: 0 hours 13 minutes 20 seconds  Total Procedure Duration: 0 hours 17 minutes 50 seconds  Findings:                 The perianal and digital rectal examinations were                            normal.                           The entire colon appeared normal.                           Non-bleeding internal hemorrhoids were found during                            retroflexion. The hemorrhoids were small and Grade                            I (internal hemorrhoids that do not prolapse). Complications:            No immediate complications.  Estimated Blood Loss:     Estimated blood loss: none. Impression:               - The entire examined colon is normal.                           - Non-bleeding internal hemorrhoids.                           - No specimens collected. Recommendation:           - Patient has a contact number available for                            emergencies. The signs and symptoms of potential                            delayed complications were discussed with the                            patient. Return to normal activities tomorrow.                            Written discharge instructions were provided to the                            patient.                           - Resume previous diet.                           - Continue present medications.                           - Repeat colonoscopy in 10 years for screening                            purposes.                            - Return to GI clinic PRN. Sandor Flatter, MD 04/08/2023 9:34:52 AM

## 2023-04-08 NOTE — Progress Notes (Signed)
   GASTROENTEROLOGY PROCEDURE H&P NOTE   Primary Care Physician: Waddell Rake, MD    Reason for Procedure:  Colon Cancer screening  Plan:    Colonoscopy  Patient is appropriate for endoscopic procedure(s) in the ambulatory ([facility]) setting.  The nature of the procedure, as well as the risks, benefits, and alternatives were carefully and thoroughly reviewed with the patient. Ample time for discussion and questions allowed. The patient understood, was satisfied, and agreed to proceed.     HPI: Philip Hall is a 53 y.o. male who presents for colonoscopy for routine Colon Cancer screening.  Fhx notable for paternal grandmother with colon cancer (age 74), father with colon polyps.  Patient is otherwise without complaints or active issues today.  No past medical history on file.  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   Current Facility-Administered Medications  Medication Dose Route Frequency Provider Last Rate Last Admin   0.9 %  sodium chloride  infusion  500 mL Intravenous Once Cruze Zingaro V, DO        Allergies as of 04/08/2023   (No Known Allergies)    Family History  Problem Relation Age of Onset   Colon polyps Father 24   Colon cancer Paternal Grandmother 65   Colon polyps Paternal Grandmother     Social History   Socioeconomic History   Marital status: Married    Spouse name: Not on file   Number of children: Not on file   Years of education: Not on file   Highest education level: Not on file  Occupational History   Not on file  Tobacco Use   Smoking status: Never   Smokeless tobacco: Never  Vaping Use   Vaping status: Never Used  Substance and Sexual Activity   Alcohol  use: Yes    Alcohol /week: 6.0 standard drinks of alcohol     Types: 6 Standard drinks or  equivalent per week   Drug use: Never   Sexual activity: Not on file  Other Topics Concern   Not on file  Social History Narrative   Not on file   Social Drivers of Health   Financial Resource Strain: Not on file  Food Insecurity: Not on file  Transportation Needs: Not on file  Physical Activity: Not on file  Stress: Not on file  Social Connections: Not on file  Intimate Partner Violence: Not on file    Physical Exam: Vital signs in last 24 hours: @BP  (!) 154/71   Pulse (!) 58   Temp 98.2 F (36.8 C)   Ht 5' 10 (1.778 m)   Wt 179 lb (81.2 kg)   SpO2 98%   BMI 25.68 kg/m  GEN: NAD EYE: Sclerae anicteric ENT: MMM CV: Non-tachycardic Pulm: CTA b/l GI: Soft, NT/ND NEURO:  Alert & Oriented x 3   Sandor Flatter, DO [facility]   04/08/2023 9:01 AM

## 2023-04-08 NOTE — Progress Notes (Signed)
 Pt's states no medical or surgical changes since previsit or office visit.

## 2023-04-08 NOTE — Patient Instructions (Signed)
 Resume previous diet and medications.  Repeat colonoscopy in 10 years.    YOU HAD AN ENDOSCOPIC PROCEDURE TODAY AT THE [facility]:   Refer to the procedure report that was given to you for any specific questions about what was found during the examination.  If the procedure report does not answer your questions, please call your gastroenterologist to clarify.  If you requested that your care partner not be given the details of your procedure findings, then the procedure report has been included in a sealed envelope for you to review at your convenience later.  YOU SHOULD EXPECT: Some feelings of bloating in the abdomen. Passage of more gas than usual.  Walking can help get rid of the air that was put into your GI tract during the procedure and reduce the bloating. If you had a lower endoscopy (such as a colonoscopy or flexible sigmoidoscopy) you may notice spotting of blood in your stool or on the toilet paper. If you underwent a bowel prep for your procedure, you may not have a normal bowel movement for a few days.  Please Note:  You might notice some irritation and congestion in your nose or some drainage.  This is from the oxygen used during your procedure.  There is no need for concern and it should clear up in a day or so.  SYMPTOMS TO REPORT IMMEDIATELY:  Following lower endoscopy (colonoscopy or flexible sigmoidoscopy):  Excessive amounts of blood in the stool  Significant tenderness or worsening of abdominal pains  Swelling of the abdomen that is new, acute  Fever of 100F or higher  For urgent or emergent issues, a gastroenterologist can be reached at any hour by calling [phone]. Do not use MyChart messaging for urgent concerns.    DIET:  We do recommend a small meal at first, but then you may proceed to your regular diet.  Drink plenty of fluids but you should avoid alcoholic beverages for 24 hours.  ACTIVITY:  You should plan to take it easy for the rest of today  and you should NOT DRIVE or use heavy machinery until tomorrow (because of the sedation medicines used during the test).    FOLLOW UP: Our staff will call the number listed on your records the next business day following your procedure.  We will call around 7:15- 8:00 am to check on you and address any questions or concerns that you may have regarding the information given to you following your procedure. If we do not reach you, we will leave a message.     If any biopsies were taken you will be contacted by phone or by letter within the next 1-3 weeks.  Please call us at [phone] if you have not heard about the biopsies in 3 weeks.    SIGNATURES/CONFIDENTIALITY: You and/or your care partner have signed paperwork which will be entered into your electronic medical record.  These signatures attest to the fact that that the information above on your After Visit Summary has been reviewed and is understood.  Full responsibility of the confidentiality of this discharge information lies with you and/or your care-partner.

## 2023-04-09 ENCOUNTER — Telehealth: Payer: Self-pay

## 2023-04-09 NOTE — Telephone Encounter (Signed)
  Follow up Call-     04/08/2023    8:41 AM  Call back number  Post procedure Call Back phone  # [phone]  Permission to leave phone message Yes     Patient questions:  Do you have a fever, pain , or abdominal swelling? No. Pain Score  0 *  Have you tolerated food without any problems? Yes.    Have you been able to return to your normal activities? Yes.    Do you have any questions about your discharge instructions: Diet   No. Medications  No. Follow up visit  No.  Do you have questions or concerns about your Care? No.  Actions: * If pain score is 4 or above: No action needed, pain <4.
